# Patient Record
Sex: Female | Born: 1975 | Race: White | Hispanic: No | Marital: Married | Smoking: Never smoker
Health system: Southern US, Community
[De-identification: ages and names within clinical notes are randomized; demographics above are authoritative.]

## PROBLEM LIST (undated history)

## (undated) DIAGNOSIS — I10 Essential (primary) hypertension: Secondary | ICD-10-CM

## (undated) DIAGNOSIS — K449 Diaphragmatic hernia without obstruction or gangrene: Secondary | ICD-10-CM

## (undated) DIAGNOSIS — N200 Calculus of kidney: Secondary | ICD-10-CM

## (undated) DIAGNOSIS — F329 Major depressive disorder, single episode, unspecified: Secondary | ICD-10-CM

## (undated) DIAGNOSIS — K219 Gastro-esophageal reflux disease without esophagitis: Secondary | ICD-10-CM

## (undated) DIAGNOSIS — F410 Panic disorder [episodic paroxysmal anxiety] without agoraphobia: Secondary | ICD-10-CM

## (undated) DIAGNOSIS — F32A Depression, unspecified: Secondary | ICD-10-CM

## (undated) DIAGNOSIS — F419 Anxiety disorder, unspecified: Secondary | ICD-10-CM

## (undated) HISTORY — DX: Diaphragmatic hernia without obstruction or gangrene: K44.9

## (undated) HISTORY — PX: AUGMENTATION MAMMAPLASTY: SUR837

## (undated) HISTORY — DX: Anxiety disorder, unspecified: F41.9

## (undated) HISTORY — PX: TONSILLECTOMY: SUR1361

## (undated) HISTORY — PX: BREAST BIOPSY: SHX20

## (undated) HISTORY — DX: Calculus of kidney: N20.0

## (undated) HISTORY — PX: CHOLECYSTECTOMY: SHX55

## (undated) HISTORY — DX: Essential (primary) hypertension: I10

## (undated) HISTORY — DX: Gastro-esophageal reflux disease without esophagitis: K21.9

## (undated) HISTORY — PX: OTHER SURGICAL HISTORY: SHX169

## (undated) HISTORY — DX: Depression, unspecified: F32.A

## (undated) HISTORY — DX: Major depressive disorder, single episode, unspecified: F32.9

## (undated) HISTORY — PX: BREAST EXCISIONAL BIOPSY: SUR124

## (undated) HISTORY — PX: BREAST ENHANCEMENT SURGERY: SHX7

## (undated) HISTORY — DX: Panic disorder (episodic paroxysmal anxiety): F41.0

---

## 1996-07-10 ENCOUNTER — Encounter: Payer: Self-pay | Admitting: Gastroenterology

## 1997-12-16 ENCOUNTER — Other Ambulatory Visit: Admission: RE | Admit: 1997-12-16 | Discharge: 1997-12-16 | Payer: Self-pay | Admitting: *Deleted

## 1998-03-10 ENCOUNTER — Encounter: Payer: Self-pay | Admitting: Gastroenterology

## 1998-03-10 ENCOUNTER — Ambulatory Visit (HOSPITAL_COMMUNITY): Admission: RE | Admit: 1998-03-10 | Discharge: 1998-03-10 | Payer: Self-pay | Admitting: Gastroenterology

## 1999-01-05 ENCOUNTER — Other Ambulatory Visit: Admission: RE | Admit: 1999-01-05 | Discharge: 1999-01-05 | Payer: Self-pay | Admitting: *Deleted

## 1999-11-09 ENCOUNTER — Other Ambulatory Visit: Admission: RE | Admit: 1999-11-09 | Discharge: 1999-11-09 | Payer: Self-pay | Admitting: *Deleted

## 1999-11-09 ENCOUNTER — Encounter (INDEPENDENT_AMBULATORY_CARE_PROVIDER_SITE_OTHER): Payer: Self-pay | Admitting: Specialist

## 2000-01-06 ENCOUNTER — Other Ambulatory Visit: Admission: RE | Admit: 2000-01-06 | Discharge: 2000-01-06 | Payer: Self-pay | Admitting: Obstetrics and Gynecology

## 2000-05-16 DIAGNOSIS — N200 Calculus of kidney: Secondary | ICD-10-CM

## 2000-05-16 HISTORY — DX: Calculus of kidney: N20.0

## 2001-01-16 ENCOUNTER — Other Ambulatory Visit: Admission: RE | Admit: 2001-01-16 | Discharge: 2001-01-16 | Payer: Self-pay | Admitting: Obstetrics and Gynecology

## 2001-08-07 ENCOUNTER — Encounter (INDEPENDENT_AMBULATORY_CARE_PROVIDER_SITE_OTHER): Payer: Self-pay

## 2001-08-07 ENCOUNTER — Inpatient Hospital Stay (HOSPITAL_COMMUNITY): Admission: AD | Admit: 2001-08-07 | Discharge: 2001-08-10 | Payer: Self-pay | Admitting: Obstetrics and Gynecology

## 2001-09-20 ENCOUNTER — Other Ambulatory Visit: Admission: RE | Admit: 2001-09-20 | Discharge: 2001-09-20 | Payer: Self-pay | Admitting: Obstetrics and Gynecology

## 2002-04-02 ENCOUNTER — Encounter: Payer: Self-pay | Admitting: Internal Medicine

## 2002-04-02 ENCOUNTER — Ambulatory Visit (HOSPITAL_COMMUNITY): Admission: RE | Admit: 2002-04-02 | Discharge: 2002-04-02 | Payer: Self-pay | Admitting: Internal Medicine

## 2002-12-04 ENCOUNTER — Other Ambulatory Visit: Admission: RE | Admit: 2002-12-04 | Discharge: 2002-12-04 | Payer: Self-pay | Admitting: Obstetrics and Gynecology

## 2003-12-16 ENCOUNTER — Inpatient Hospital Stay (HOSPITAL_COMMUNITY): Admission: AD | Admit: 2003-12-16 | Discharge: 2003-12-18 | Payer: Self-pay | Admitting: Obstetrics and Gynecology

## 2004-01-23 ENCOUNTER — Other Ambulatory Visit: Admission: RE | Admit: 2004-01-23 | Discharge: 2004-01-23 | Payer: Self-pay | Admitting: Obstetrics and Gynecology

## 2004-08-20 ENCOUNTER — Other Ambulatory Visit: Admission: RE | Admit: 2004-08-20 | Discharge: 2004-08-20 | Payer: Self-pay | Admitting: Obstetrics and Gynecology

## 2005-01-17 ENCOUNTER — Inpatient Hospital Stay (HOSPITAL_COMMUNITY): Admission: AD | Admit: 2005-01-17 | Discharge: 2005-01-17 | Payer: Self-pay | Admitting: Obstetrics and Gynecology

## 2005-03-07 ENCOUNTER — Inpatient Hospital Stay (HOSPITAL_COMMUNITY): Admission: AD | Admit: 2005-03-07 | Discharge: 2005-03-09 | Payer: Self-pay | Admitting: *Deleted

## 2005-04-04 ENCOUNTER — Other Ambulatory Visit: Admission: RE | Admit: 2005-04-04 | Discharge: 2005-04-04 | Payer: Self-pay | Admitting: Obstetrics and Gynecology

## 2005-10-14 ENCOUNTER — Emergency Department (HOSPITAL_COMMUNITY): Admission: EM | Admit: 2005-10-14 | Discharge: 2005-10-14 | Payer: Self-pay | Admitting: Emergency Medicine

## 2006-03-24 ENCOUNTER — Ambulatory Visit: Payer: Self-pay | Admitting: Gastroenterology

## 2006-03-31 ENCOUNTER — Ambulatory Visit: Payer: Self-pay | Admitting: Gastroenterology

## 2006-04-20 ENCOUNTER — Encounter: Admission: RE | Admit: 2006-04-20 | Discharge: 2006-04-20 | Payer: Self-pay | Admitting: Obstetrics and Gynecology

## 2008-01-05 ENCOUNTER — Inpatient Hospital Stay (HOSPITAL_COMMUNITY): Admission: AD | Admit: 2008-01-05 | Discharge: 2008-01-07 | Payer: Self-pay | Admitting: Obstetrics and Gynecology

## 2008-10-24 DIAGNOSIS — K449 Diaphragmatic hernia without obstruction or gangrene: Secondary | ICD-10-CM | POA: Insufficient documentation

## 2008-10-24 DIAGNOSIS — F410 Panic disorder [episodic paroxysmal anxiety] without agoraphobia: Secondary | ICD-10-CM | POA: Insufficient documentation

## 2008-10-24 DIAGNOSIS — K219 Gastro-esophageal reflux disease without esophagitis: Secondary | ICD-10-CM

## 2008-10-28 ENCOUNTER — Ambulatory Visit: Payer: Self-pay | Admitting: Gastroenterology

## 2008-10-28 DIAGNOSIS — R1032 Left lower quadrant pain: Secondary | ICD-10-CM

## 2008-10-28 DIAGNOSIS — K589 Irritable bowel syndrome without diarrhea: Secondary | ICD-10-CM

## 2008-10-28 DIAGNOSIS — R1012 Left upper quadrant pain: Secondary | ICD-10-CM

## 2008-10-28 DIAGNOSIS — R1084 Generalized abdominal pain: Secondary | ICD-10-CM | POA: Insufficient documentation

## 2008-10-29 ENCOUNTER — Ambulatory Visit: Payer: Self-pay | Admitting: Gastroenterology

## 2008-11-18 ENCOUNTER — Ambulatory Visit: Payer: Self-pay | Admitting: Genetic Counselor

## 2008-11-28 ENCOUNTER — Encounter: Admission: RE | Admit: 2008-11-28 | Discharge: 2008-11-28 | Payer: Self-pay | Admitting: Obstetrics and Gynecology

## 2009-02-10 ENCOUNTER — Ambulatory Visit: Payer: Self-pay | Admitting: Gastroenterology

## 2009-02-10 DIAGNOSIS — R1011 Right upper quadrant pain: Secondary | ICD-10-CM

## 2009-02-10 LAB — CONVERTED CEMR LAB
AST: 25 units/L (ref 0–37)
Basophils Absolute: 0 10*3/uL (ref 0.0–0.1)
Bilirubin, Direct: 0.1 mg/dL (ref 0.0–0.3)
Eosinophils Relative: 2.3 % (ref 0.0–5.0)
Ferritin: 20.3 ng/mL (ref 10.0–291.0)
Folate: >20 ng/mL
GFR calc non Af Amer: 102.52 mL/min (ref >60)
Glucose, Bld: 96 mg/dL (ref 70–99)
HCT: 42 % (ref 36.0–46.0)
Hemoglobin: 14.6 g/dL (ref 12.0–15.0)
Iron: 83 ug/dL (ref 42–145)
MCHC: 34.8 g/dL (ref 30.0–36.0)
MCV: 87.7 fL (ref 78.0–100.0)
Neutro Abs: 5.5 10*3/uL (ref 1.4–7.7)
Platelets: 175 10*3/uL (ref 150.0–400.0)
RBC: 4.79 M/uL (ref 3.87–5.11)
Saturation Ratios: 25.1 % (ref 20.0–50.0)
Sodium: 142 meq/L (ref 135–145)
TSH: 0.7 microintl units/mL (ref 0.35–5.50)
Total Bilirubin: 0.7 mg/dL (ref 0.3–1.2)
Total Protein: 7.2 g/dL (ref 6.0–8.3)
Transferrin: 235.9 mg/dL (ref 212.0–360.0)

## 2009-02-11 ENCOUNTER — Ambulatory Visit: Payer: Self-pay | Admitting: Gastroenterology

## 2009-02-11 ENCOUNTER — Encounter: Payer: Self-pay | Admitting: Gastroenterology

## 2009-02-11 LAB — CONVERTED CEMR LAB: UREASE: NEGATIVE

## 2009-02-12 ENCOUNTER — Telehealth: Payer: Self-pay | Admitting: Gastroenterology

## 2009-02-12 ENCOUNTER — Ambulatory Visit (HOSPITAL_COMMUNITY): Admission: RE | Admit: 2009-02-12 | Discharge: 2009-02-12 | Payer: Self-pay | Admitting: Gastroenterology

## 2009-02-13 ENCOUNTER — Encounter: Payer: Self-pay | Admitting: Gastroenterology

## 2010-03-31 ENCOUNTER — Encounter: Admission: RE | Admit: 2010-03-31 | Discharge: 2010-03-31 | Payer: Self-pay | Admitting: Obstetrics and Gynecology

## 2010-06-07 ENCOUNTER — Encounter: Payer: Self-pay | Admitting: Gastroenterology

## 2010-09-08 ENCOUNTER — Other Ambulatory Visit: Payer: Self-pay | Admitting: Obstetrics and Gynecology

## 2010-09-08 DIAGNOSIS — Z09 Encounter for follow-up examination after completed treatment for conditions other than malignant neoplasm: Secondary | ICD-10-CM

## 2010-10-01 NOTE — Assessment & Plan Note (Signed)
[facility]                           GASTROENTEROLOGY OFFICE NOTE   CURRY, DULSKI                       MRN:          [id number]  DATE:03/24/2006                            DOB:          [birth year]    Ms. Billick is a 35 year old white female, self-referred for right upper  quadrant, right mid-abdominal pain, which has been present for the last  several months.  She describes a gas-like pain, which has no real  precipitating or alleviating elements.  She is having regular bowel  movements.  It occurs several times a day and lasts an hour in duration,  then may relieve for three to four days at a time.  She has had no upper GI  or hepatobiliary complaints, except for recurrent acid reflux for which she  takes Nexium.  She has been off this for the last year because of nursing  her infant child, but has been back on it for the last several weeks and  seems to be doing well with her acid reflux symptoms.  She apparently had an  endoscopy seven or eight years ago, although this report is not available.  She has never had colonoscopy, but has seen Dr. Ouida Sills, had a recent negative  hepatobiliary scan.   She denies clay-colored stools, dark urine, icterus, fever, chills, nausea  and vomiting, anorexia or weight-loss.  She has had no specific food  intolerances.  She has no systemic complaints.  She does not have known  lactose intolerance.   PAST MEDICAL HISTORY:  Otherwise unremarkable, except for a history of panic  disorder, and she is under a lot of personal stress with three small  children, under the age of 23.  She has had no pelvic surgery and has no  other medical problems.   FAMILY HISTORY:  Remarkable for colon polyps in her mother and grandmother.   SOCIAL HISTORY:  She is married and lives with her husband and three  children.  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  She does not  smoke or abuse ethanol.   REVIEW OF  SYSTEMS:  Otherwise entirely noncontributory, without any  cardiovascular, pulmonary, genitourinary, neurologic, orthopedic, endocrine  or gynecologic problems at this time.  She is followed by Dr. Rana Snare for her  GYN.   MEDICATIONS AT THIS TIME:  1. Nexium 40 mg a day.  2. Multivitamins.   ALLERGIES:  In the past, she has had reactions to [facility], LIBRAX and  PROPULSID.   EXAM:  Shows her to be an attractive, healthy-appearing, white female,  appearing stated age, in no distress.  She is 5 feet 6 inches tall and  weighs 134 pounds. Blood pressure 102/64 and pulse of 64 and regular.  I  could not appreciate stigmata of chronic liver disease or thyromegaly.  Her  chest was clear to percussion and auscultation.  She was in a regular rhythm  and I could appreciate no murmurs, gallops or rubs.  There was no  hepatosplenomegaly, abdominal masses or significant tenderness.  Bowel  sounds were normal.  Peripheral extremities were unremarkable.  Rectal exam  was deferred.   ASSESSMENT:  I suspect Mrs. 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  It is unlikely that  she has underlying inflammatory bowel disease.   RECOMMENDATIONS:  1. Low-gas diet as tolerated with p.r.n. sublingual Levsin use.  2. Check screening laboratory parameters, including liver function tests,      amylase and lipase and sprue panel.  3. Repeat upper abdominal ultrasound exam.  4. To have GI followup in several weeks' time and see how she is doing      clinically.  Depending on clinical course, we may need to consider      colonoscopy, especially with a family history of colon polyps in first-      degree relatives.     Vania Rea. Jarold Motto, MD, Caleen Essex, FAGA  Electronically Signed    DRP/MedQ  DD: 03/24/2006  DT: 03/24/2006  Job #: [id number]

## 2010-10-01 NOTE — H&P (Signed)
[facility]  Patient:    Courtney Black, Courtney Black Visit Number: [id number] MRN: [id number]          Service Type: Attending:  Juluis Mire, M.D. Dictated by:   Juluis Mire, M.D. Adm. Date:  08/07/01                           History and Physical  HISTORY OF PRESENT ILLNESS:   The patient is a 35 year old primigravida married female who presents for Cytotec ripening of the cervix and subsequent induction.  Last menstrual period was November 18, 2000 giving her an estimated date of confinement of April 14.  This gives her an estimated gestational age of [redacted] weeks.  By early ultrasound she has an estimated date of confinement of April 25 and an estimated gestational age of [redacted] weeks.  Her prenatal course has been complicated by slightly elevated mean arterial pressures.  Systolic values have ranged in the 140 values with diastolics over 80.  She has been watched with NSTs which have been reactive, and blood work which has been unremarkable.  She did have an ultrasound performed on March 24 that revealed a normal amniotic fluid and growth pattern, although the estimated fetal weight was in the 25th percentile.  Her blood pressure recheck at that time was 150/98.  She now presents for Cytotec ripening of the cervix and subsequent induction.  She does have a positive group B strep screening culture.  Her 50 g Glucola was normal with a 106 value.  ALLERGIES:                    KEFLEX, LIBRAX, and PROPULSID.  HISTORY:                      For past medical history, family history, and social history please see prenatal records.  PRENATAL LABORATORY DATA:     She is A positive, negative antibody screen. Nonreactive serology.  Immune rubella titer.  Negative hepatitis B surface antigen.  Nonreactive HIV screen.  PHYSICAL EXAMINATION:  VITAL SIGNS:                  Patient is afebrile.  Blood pressure 150/98. Other vital signs are stable.  HEENT:                         Patient normocephalic.  Pupils equal, round, and reactive to light and accommodation.  Extraocular movements were intact. Sclerae and conjunctivae were clear.  Oropharynx clear.  NECK:                         Without thyromegaly.  LUNGS:                        Clear.  CARDIOVASCULAR:               Regular rhythm and rate with a grade 2/6 systolic ejection murmur.  No clicks or gallops.  BREASTS:                      Not examined.  ABDOMEN:                      Gravid uterus consistent with dates. PELVIC:  The cervix is long and closed, vertex presenting.  Fetal heart rate is reactive.  EXTREMITIES:                  Show 1+ edema.  Deep tendon reflexes 2+, no clonus.  IMPRESSION:                   1. Intrauterine pregnancy at 37-38 weeks with                                  pregnancy-induced hypertension.                               2. Positive group B strep screen.  PLAN:                         The patient to undergo Cytotec ripening of the cervix with subsequent induction.  Antibiotics will be used for the group B strep. Dictated by:   Juluis Mire, M.D. Attending:  Juluis Mire, M.D. DD:  08/08/01 TD:  08/08/01 Job: [id number]

## 2010-10-04 ENCOUNTER — Ambulatory Visit
Admission: RE | Admit: 2010-10-04 | Discharge: 2010-10-04 | Disposition: A | Discharge status: Home / Self Care | Payer: 59 | Source: Ambulatory Visit | Attending: Obstetrics and Gynecology | Admitting: Obstetrics and Gynecology

## 2010-10-04 DIAGNOSIS — Z09 Encounter for follow-up examination after completed treatment for conditions other than malignant neoplasm: Secondary | ICD-10-CM

## 2011-02-11 ENCOUNTER — Other Ambulatory Visit: Payer: Self-pay | Admitting: Gastroenterology

## 2011-02-16 ENCOUNTER — Encounter: Payer: Self-pay | Admitting: *Deleted

## 2011-02-18 ENCOUNTER — Ambulatory Visit (INDEPENDENT_AMBULATORY_CARE_PROVIDER_SITE_OTHER): Payer: 59 | Admitting: Gastroenterology

## 2011-02-18 ENCOUNTER — Encounter: Payer: Self-pay | Admitting: Gastroenterology

## 2011-02-18 VITALS — BP 112/70 | HR 73 | Ht 66.0 in | Wt 142.0 lb

## 2011-02-18 DIAGNOSIS — K589 Irritable bowel syndrome without diarrhea: Secondary | ICD-10-CM

## 2011-02-18 DIAGNOSIS — Z800 Family history of malignant neoplasm of digestive organs: Secondary | ICD-10-CM | POA: Insufficient documentation

## 2011-02-18 DIAGNOSIS — K625 Hemorrhage of anus and rectum: Secondary | ICD-10-CM

## 2011-02-18 DIAGNOSIS — F410 Panic disorder [episodic paroxysmal anxiety] without agoraphobia: Secondary | ICD-10-CM | POA: Insufficient documentation

## 2011-02-18 MED ORDER — CILIDINIUM-CHLORDIAZEPOXIDE 2.5-5 MG PO CAPS: 1.0000 | ORAL_CAPSULE | Freq: Three times a day (TID) | ORAL | Status: DC

## 2011-02-18 MED ORDER — PRAMOXINE-HC 1-1 % EX CREA: TOPICAL_CREAM | CUTANEOUS | Status: AC

## 2011-02-18 MED ORDER — PEG-KCL-NACL-NASULF-NA ASC-C 100 G PO SOLR: 1.0000 | Freq: Once | ORAL | Status: DC

## 2011-02-18 MED ORDER — ESOMEPRAZOLE MAGNESIUM 40 MG PO CPDR: 40.0000 mg | DELAYED_RELEASE_CAPSULE | Freq: Every day | ORAL | Status: DC

## 2011-02-18 NOTE — Patient Instructions (Signed)
Colon [facility] 02/23/11 2:30 pm arrive at 1:30 pm  Moviprep, Librax, and analpram cream sent to your pharmacy

## 2011-02-18 NOTE — Progress Notes (Signed)
This is a stringy pleasant 35 year old Caucasian female mother of 4 children. Her current right-sided abdominal pain which led to cholecystectomy in 2010. She continued with IBS-type complaints with gas, bloating, and" painful diarrhea" and intermittent rectal bleeding. She has a long history of anxiety and panic attacks and uses Lexapro 5-10 mg on a when necessary basis. For acid reflux she takes Nexium 40 mg a day on a chronic basis. She has had previous endoscopy but not colonoscopy. Family history is remarkable for colon cancer and colon polyps in her mother and father. She has not had previous colonoscopy. The patient relates she has been told in the past that she had a rectal fissure. Her abdominal pain continues to be in the right lower quadrant area, it is definitely exacerbated by stress and partially relieved by bowel movement. She denies any specific food intolerances, anorexia, weight loss, current hepatobiliary or systemic complaints.  Current Medications, Allergies, Past Medical History, Past Surgical History, Family History and Social History were reviewed in [employer] record.  Pertinent Review of Systems Negative   Physical Exam: Healthy appearing in no acute distress. I cannot appreciate stigmata of chronic liver disease. Chest is clear, cardiac exam is unremarkable. There is no hepatosplenomegaly, abdominal masses or tenderness. Bowel sounds are normal. Inspection rectum shows a superficial anterior fissure with a small anal tag. I could not appreciate any fistulization or other abnormalities. Digital exam was not performed. Mental status is normal.    Assessment and Plan: Stress related IBS with probable local rectal bleeding. I placed her on Librax every 6-8 hours on a when necessary basis while continuing a high fiber diet as tolerated. I have scheduled outpatient colonoscopy at her convenience with propofol sedation. I have urged her to resume her Lexapro on  a regular basis , and to continue her Nexium for acid reflux symptoms. Encounter Diagnoses  Name Primary?  . Rectal bleeding Yes  . IBS (irritable bowel syndrome)   . Family history of malignant neoplasm of gastrointestinal tract   . Panic disorder

## 2011-02-23 ENCOUNTER — Encounter: Payer: Self-pay | Admitting: Gastroenterology

## 2011-02-23 ENCOUNTER — Ambulatory Visit (AMBULATORY_SURGERY_CENTER): Payer: 59 | Admitting: Gastroenterology

## 2011-02-23 ENCOUNTER — Other Ambulatory Visit: Payer: Self-pay | Admitting: Gastroenterology

## 2011-02-23 VITALS — BP 131/83 | HR 87 | Temp 99.0°F | Resp 15 | Ht 66.0 in | Wt 142.0 lb

## 2011-02-23 DIAGNOSIS — R1032 Left lower quadrant pain: Secondary | ICD-10-CM

## 2011-02-23 DIAGNOSIS — K625 Hemorrhage of anus and rectum: Secondary | ICD-10-CM

## 2011-02-23 DIAGNOSIS — K589 Irritable bowel syndrome without diarrhea: Secondary | ICD-10-CM

## 2011-02-23 DIAGNOSIS — Z800 Family history of malignant neoplasm of digestive organs: Secondary | ICD-10-CM

## 2011-02-23 MED ORDER — COLESTIPOL HCL 1 G PO TABS: 1.0000 g | ORAL_TABLET | Freq: Every day | ORAL | Status: DC

## 2011-02-23 MED ORDER — SODIUM CHLORIDE 0.9 % IV SOLN: 500.0000 mL | INTRAVENOUS | Status: DC

## 2011-02-23 NOTE — Progress Notes (Signed)
Pt states last week when she saw Dr Jarold Motto he prescribed Librax. Pt has had an allergic reaction to this in the past ( hives ) and she states she did not get it filled and ? If Dr Jarold Motto wanted to change to something different. EWM

## 2011-02-23 NOTE — Patient Instructions (Addendum)
Please refer to the blue and neon green sheets for instructions regarding diet and activity for the rest of today.  Resume previous medications. Follow up appointment on March 25, 2011 at 8;45am.

## 2011-02-23 NOTE — Progress Notes (Signed)
[employer] scope trial in procedure room 3 today.  Frann Rider, [employer] rep, will be in and out of the procedure room. Maw  Pt uncomfortable while the scope was being advanced.  Medications were ordered by Dr. Jarold Motto.  The pt did relax and rested with her eyes closed, comfortably on the way out of the cecum.  maw

## 2011-02-24 ENCOUNTER — Telehealth: Payer: Self-pay | Admitting: *Deleted

## 2011-02-24 NOTE — Telephone Encounter (Signed)

## 2011-03-23 ENCOUNTER — Encounter: Payer: Self-pay | Admitting: Gastroenterology

## 2011-03-25 ENCOUNTER — Ambulatory Visit (INDEPENDENT_AMBULATORY_CARE_PROVIDER_SITE_OTHER): Payer: 59 | Admitting: Gastroenterology

## 2011-03-25 ENCOUNTER — Encounter: Payer: Self-pay | Admitting: Gastroenterology

## 2011-03-25 VITALS — BP 96/62 | HR 64 | Ht 66.0 in | Wt 143.6 lb

## 2011-03-25 DIAGNOSIS — K589 Irritable bowel syndrome without diarrhea: Secondary | ICD-10-CM

## 2011-03-25 DIAGNOSIS — K219 Gastro-esophageal reflux disease without esophagitis: Secondary | ICD-10-CM

## 2011-03-25 NOTE — Progress Notes (Signed)
History of Present Illness: This is a very pleasant 35 year old Caucasian female with IBS, postcholecystectomy diarrhea, GERD, panic disorder. She currently is asymptomatic on her multiple medications, and has adjusted her Colestid appropriately. She denies any significant symptomatology at this time.    Current Medications, Allergies, Past Medical History, Past Surgical History, Family History and Social History were reviewed in [employer] record.   Assessment and plan: Speaking with the patient, she is a large amount of by mouth sorbitol with diet gum. Given her sheet of nonabsorbable carbohydrates to avoid, we'll continue to adjust her Colestid as needed, and to continue her Nexium and Lexapro. We will see her on a when necessary basis as needed. Encounter Diagnoses  Name Primary?  . IBS (irritable bowel syndrome) Yes  . GERD (gastroesophageal reflux disease)     r

## 2012-03-09 ENCOUNTER — Other Ambulatory Visit: Payer: Self-pay | Admitting: Dermatology

## 2012-03-23 ENCOUNTER — Other Ambulatory Visit: Payer: Self-pay | Admitting: Gastroenterology

## 2012-03-23 NOTE — Telephone Encounter (Signed)
MUST HAVE OFFICE VISIT FOR FURTHER REFILLS

## 2012-08-06 ENCOUNTER — Other Ambulatory Visit: Payer: Self-pay | Admitting: Gastroenterology

## 2012-08-07 NOTE — Telephone Encounter (Signed)
PATIENT WILL NEED AN OFFICE VISIT FOR FURTHER REFILLS  

## 2013-08-27 ENCOUNTER — Ambulatory Visit (INDEPENDENT_AMBULATORY_CARE_PROVIDER_SITE_OTHER): Payer: 59 | Admitting: Urology

## 2013-08-27 DIAGNOSIS — IMO0002 Reserved for concepts with insufficient information to code with codable children: Secondary | ICD-10-CM

## 2013-08-27 DIAGNOSIS — R3989 Other symptoms and signs involving the genitourinary system: Secondary | ICD-10-CM

## 2013-10-01 ENCOUNTER — Ambulatory Visit (INDEPENDENT_AMBULATORY_CARE_PROVIDER_SITE_OTHER): Payer: 59 | Admitting: Urology

## 2013-10-01 DIAGNOSIS — IMO0002 Reserved for concepts with insufficient information to code with codable children: Secondary | ICD-10-CM

## 2013-10-01 DIAGNOSIS — N949 Unspecified condition associated with female genital organs and menstrual cycle: Secondary | ICD-10-CM

## 2013-10-01 DIAGNOSIS — R3989 Other symptoms and signs involving the genitourinary system: Secondary | ICD-10-CM

## 2014-01-07 ENCOUNTER — Ambulatory Visit: Payer: 59 | Admitting: Urology

## 2014-07-24 ENCOUNTER — Other Ambulatory Visit: Payer: Self-pay | Admitting: Obstetrics and Gynecology

## 2014-07-25 LAB — CYTOLOGY - PAP

## 2015-09-17 ENCOUNTER — Encounter: Payer: Self-pay | Admitting: Gastroenterology

## 2016-03-01 ENCOUNTER — Other Ambulatory Visit: Payer: Self-pay | Admitting: Obstetrics and Gynecology

## 2016-03-01 DIAGNOSIS — N6310 Unspecified lump in the right breast, unspecified quadrant: Secondary | ICD-10-CM

## 2016-03-08 ENCOUNTER — Ambulatory Visit
Admission: RE | Admit: 2016-03-08 | Discharge: 2016-03-08 | Disposition: A | Discharge status: Home / Self Care | Payer: Commercial Managed Care - HMO | Source: Ambulatory Visit | Attending: Obstetrics and Gynecology | Admitting: Obstetrics and Gynecology

## 2016-03-08 ENCOUNTER — Other Ambulatory Visit: Payer: Self-pay | Admitting: Obstetrics and Gynecology

## 2016-03-08 DIAGNOSIS — N6310 Unspecified lump in the right breast, unspecified quadrant: Secondary | ICD-10-CM

## 2016-06-20 DIAGNOSIS — Z23 Encounter for immunization: Secondary | ICD-10-CM | POA: Diagnosis not present

## 2016-09-07 DIAGNOSIS — Z01419 Encounter for gynecological examination (general) (routine) without abnormal findings: Secondary | ICD-10-CM | POA: Diagnosis not present

## 2016-11-02 DIAGNOSIS — H5213 Myopia, bilateral: Secondary | ICD-10-CM | POA: Diagnosis not present

## 2016-11-02 DIAGNOSIS — H52223 Regular astigmatism, bilateral: Secondary | ICD-10-CM | POA: Diagnosis not present

## 2016-12-29 DIAGNOSIS — Z1389 Encounter for screening for other disorder: Secondary | ICD-10-CM | POA: Diagnosis not present

## 2016-12-29 DIAGNOSIS — Z23 Encounter for immunization: Secondary | ICD-10-CM | POA: Diagnosis not present

## 2016-12-29 DIAGNOSIS — Z9189 Other specified personal risk factors, not elsewhere classified: Secondary | ICD-10-CM | POA: Diagnosis not present

## 2017-01-27 ENCOUNTER — Other Ambulatory Visit: Payer: Self-pay | Admitting: Obstetrics and Gynecology

## 2017-01-27 DIAGNOSIS — Z1231 Encounter for screening mammogram for malignant neoplasm of breast: Secondary | ICD-10-CM

## 2017-03-09 ENCOUNTER — Ambulatory Visit
Admission: RE | Admit: 2017-03-09 | Discharge: 2017-03-09 | Disposition: A | Discharge status: Home / Self Care | Payer: 59 | Source: Ambulatory Visit | Attending: Obstetrics and Gynecology | Admitting: Obstetrics and Gynecology

## 2017-03-09 DIAGNOSIS — Z1231 Encounter for screening mammogram for malignant neoplasm of breast: Secondary | ICD-10-CM

## 2017-09-19 DIAGNOSIS — Z131 Encounter for screening for diabetes mellitus: Secondary | ICD-10-CM | POA: Diagnosis not present

## 2017-09-19 DIAGNOSIS — Z13228 Encounter for screening for other metabolic disorders: Secondary | ICD-10-CM | POA: Diagnosis not present

## 2017-09-19 DIAGNOSIS — Z13220 Encounter for screening for lipoid disorders: Secondary | ICD-10-CM | POA: Diagnosis not present

## 2017-09-19 DIAGNOSIS — Z01419 Encounter for gynecological examination (general) (routine) without abnormal findings: Secondary | ICD-10-CM | POA: Diagnosis not present

## 2017-11-15 DIAGNOSIS — R110 Nausea: Secondary | ICD-10-CM | POA: Diagnosis not present

## 2017-11-15 DIAGNOSIS — S30861A Insect bite (nonvenomous) of abdominal wall, initial encounter: Secondary | ICD-10-CM | POA: Diagnosis not present

## 2017-11-15 DIAGNOSIS — R109 Unspecified abdominal pain: Secondary | ICD-10-CM | POA: Diagnosis not present

## 2018-01-26 DIAGNOSIS — B078 Other viral warts: Secondary | ICD-10-CM | POA: Diagnosis not present

## 2018-01-29 ENCOUNTER — Other Ambulatory Visit: Payer: Self-pay | Admitting: Obstetrics and Gynecology

## 2018-01-29 DIAGNOSIS — Z1231 Encounter for screening mammogram for malignant neoplasm of breast: Secondary | ICD-10-CM

## 2018-03-05 DIAGNOSIS — N83201 Unspecified ovarian cyst, right side: Secondary | ICD-10-CM | POA: Diagnosis not present

## 2018-03-05 DIAGNOSIS — Z3200 Encounter for pregnancy test, result unknown: Secondary | ICD-10-CM | POA: Diagnosis not present

## 2018-03-05 DIAGNOSIS — R1031 Right lower quadrant pain: Secondary | ICD-10-CM | POA: Diagnosis not present

## 2018-03-12 ENCOUNTER — Ambulatory Visit
Admission: RE | Admit: 2018-03-12 | Discharge: 2018-03-12 | Disposition: A | Discharge status: Home / Self Care | Payer: 59 | Source: Ambulatory Visit | Attending: Obstetrics and Gynecology | Admitting: Obstetrics and Gynecology

## 2018-03-12 DIAGNOSIS — Z1231 Encounter for screening mammogram for malignant neoplasm of breast: Secondary | ICD-10-CM

## 2018-09-25 DIAGNOSIS — Z1329 Encounter for screening for other suspected endocrine disorder: Secondary | ICD-10-CM | POA: Diagnosis not present

## 2018-09-25 DIAGNOSIS — Z01419 Encounter for gynecological examination (general) (routine) without abnormal findings: Secondary | ICD-10-CM | POA: Diagnosis not present

## 2018-09-25 DIAGNOSIS — Z6826 Body mass index (BMI) 26.0-26.9, adult: Secondary | ICD-10-CM | POA: Diagnosis not present

## 2018-09-25 DIAGNOSIS — Z1321 Encounter for screening for nutritional disorder: Secondary | ICD-10-CM | POA: Diagnosis not present

## 2018-09-25 DIAGNOSIS — Z131 Encounter for screening for diabetes mellitus: Secondary | ICD-10-CM | POA: Diagnosis not present

## 2019-03-06 ENCOUNTER — Other Ambulatory Visit: Payer: Self-pay | Admitting: Obstetrics and Gynecology

## 2019-03-06 DIAGNOSIS — Z1231 Encounter for screening mammogram for malignant neoplasm of breast: Secondary | ICD-10-CM

## 2019-04-25 ENCOUNTER — Ambulatory Visit
Admission: RE | Admit: 2019-04-25 | Discharge: 2019-04-25 | Disposition: A | Discharge status: Home / Self Care | Payer: 59 | Source: Ambulatory Visit | Attending: Obstetrics and Gynecology | Admitting: Obstetrics and Gynecology

## 2019-04-25 ENCOUNTER — Other Ambulatory Visit: Payer: Self-pay

## 2019-04-25 DIAGNOSIS — Z1231 Encounter for screening mammogram for malignant neoplasm of breast: Secondary | ICD-10-CM

## 2020-05-21 ENCOUNTER — Other Ambulatory Visit: Payer: Self-pay | Admitting: Obstetrics and Gynecology

## 2020-05-21 DIAGNOSIS — Z1231 Encounter for screening mammogram for malignant neoplasm of breast: Secondary | ICD-10-CM

## 2020-07-02 ENCOUNTER — Ambulatory Visit
Admission: RE | Admit: 2020-07-02 | Discharge: 2020-07-02 | Disposition: A | Discharge status: Home / Self Care | Payer: 59 | Source: Ambulatory Visit | Attending: Obstetrics and Gynecology | Admitting: Obstetrics and Gynecology

## 2020-07-02 ENCOUNTER — Other Ambulatory Visit: Payer: Self-pay

## 2020-07-02 DIAGNOSIS — Z1231 Encounter for screening mammogram for malignant neoplasm of breast: Secondary | ICD-10-CM

## 2020-07-07 ENCOUNTER — Other Ambulatory Visit: Payer: Self-pay | Admitting: Obstetrics and Gynecology

## 2020-07-07 DIAGNOSIS — R928 Other abnormal and inconclusive findings on diagnostic imaging of breast: Secondary | ICD-10-CM

## 2020-07-16 ENCOUNTER — Other Ambulatory Visit: Payer: Self-pay

## 2020-07-16 ENCOUNTER — Ambulatory Visit
Admission: RE | Admit: 2020-07-16 | Discharge: 2020-07-16 | Disposition: A | Discharge status: Home / Self Care | Payer: 59 | Source: Ambulatory Visit | Attending: Obstetrics and Gynecology | Admitting: Obstetrics and Gynecology

## 2020-07-16 ENCOUNTER — Other Ambulatory Visit: Payer: Self-pay | Admitting: Obstetrics and Gynecology

## 2020-07-16 DIAGNOSIS — R928 Other abnormal and inconclusive findings on diagnostic imaging of breast: Secondary | ICD-10-CM

## 2021-01-22 ENCOUNTER — Other Ambulatory Visit: Payer: Self-pay

## 2021-01-22 ENCOUNTER — Ambulatory Visit
Admission: RE | Admit: 2021-01-22 | Discharge: 2021-01-22 | Disposition: A | Discharge status: Home / Self Care | Payer: 59 | Source: Ambulatory Visit | Attending: Obstetrics and Gynecology | Admitting: Obstetrics and Gynecology

## 2021-01-22 DIAGNOSIS — R928 Other abnormal and inconclusive findings on diagnostic imaging of breast: Secondary | ICD-10-CM

## 2021-09-02 ENCOUNTER — Other Ambulatory Visit: Payer: Self-pay | Admitting: Obstetrics and Gynecology

## 2021-09-02 DIAGNOSIS — Z1231 Encounter for screening mammogram for malignant neoplasm of breast: Secondary | ICD-10-CM

## 2021-09-03 ENCOUNTER — Ambulatory Visit
Admission: RE | Admit: 2021-09-03 | Discharge: 2021-09-03 | Disposition: A | Discharge status: Home / Self Care | Payer: 59 | Source: Ambulatory Visit | Attending: Obstetrics and Gynecology | Admitting: Obstetrics and Gynecology

## 2021-09-03 DIAGNOSIS — Z1231 Encounter for screening mammogram for malignant neoplasm of breast: Secondary | ICD-10-CM

## 2021-09-10 ENCOUNTER — Encounter: Payer: Self-pay | Admitting: Gastroenterology

## 2021-09-10 ENCOUNTER — Ambulatory Visit (INDEPENDENT_AMBULATORY_CARE_PROVIDER_SITE_OTHER): Payer: 59 | Admitting: Gastroenterology

## 2021-09-10 VITALS — BP 130/82 | HR 70 | Ht 66.0 in | Wt 182.2 lb

## 2021-09-10 DIAGNOSIS — Z1211 Encounter for screening for malignant neoplasm of colon: Secondary | ICD-10-CM | POA: Diagnosis not present

## 2021-09-10 DIAGNOSIS — K219 Gastro-esophageal reflux disease without esophagitis: Secondary | ICD-10-CM | POA: Diagnosis not present

## 2021-09-10 DIAGNOSIS — Z1212 Encounter for screening for malignant neoplasm of rectum: Secondary | ICD-10-CM | POA: Diagnosis not present

## 2021-09-10 DIAGNOSIS — T781XXA Other adverse food reactions, not elsewhere classified, initial encounter: Secondary | ICD-10-CM

## 2021-09-10 MED ORDER — SUTAB 1479-225-188 MG PO TABS: 1.0000 | ORAL_TABLET | Freq: Once | ORAL | 0 refills | Status: AC

## 2021-09-10 MED ORDER — OMEPRAZOLE 20 MG PO CPDR: 20.0000 mg | DELAYED_RELEASE_CAPSULE | ORAL | 3 refills | Status: DC | PRN

## 2021-09-10 NOTE — Progress Notes (Signed)
? ?HPI : Courtney Black is a very pleasant 46 year old female with history of anxiety, depression and IBS who is referred to [place] bu Dr. Louretta Shorten for colon cancer screening.  Patient has a long history of severe abdominal pain attacks with nausea and diarrhea.  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  She has cut out red meat and pork, and since then has not had any problems with abdominal pain.  She has regular bowel movements most days.  Her stool consistency does vary, but she denies problems with constipation, diarrhea or blood in her stool.  No family history of colon cancer.  She had a colonoscopy in October 2012 to evaluate her GI symptoms, which was completely normal. ?She also has a history of chronic GERD symptoms.  She underwent an upper endoscopy in 1998 and again in 2010.  Both times, she was noted to have a hiatal hernia, but normal-appearing esophagus without evidence of esophagitis.  Her GERD symptoms have improved.  She used to take PPIs daily for many years.  Now, she takes PPIs on a as needed basis, which is usually related to certain foods.  Her GERD symptoms consist of heartburn and acid regurgitation. ? ? ?Past Medical History:  ?Diagnosis Date  ? Anxiety   ? Depression   ? Diaphragmatic hernia without mention of obstruction or gangrene   ? Esophageal reflux   ? Kidney stone 2002  ? Panic disorder without agoraphobia   ? ?Colonoscopy, February 23, 2011 (Dr. Sharlett Iles): Normal colonoscopy ?EGD, February 11, 2009 (Dr. Sharlett Iles): 2 cm hiatal hernia, otherwise normal EGD ?EGD, July 10, 1996 (Dr. Sharlett Iles): 4-5 cm hiatal hernia, no erosive esophagitis or stricture, normal stomach and duodenum ?Past Surgical History:  ?Procedure Laterality Date  ? BREAST BIOPSY    ? lt. breast  ? BREAST ENHANCEMENT SURGERY    ? CHOLECYSTECTOMY    ? mini tummy tuck    ? excess skin removed from abdomen  ? TONSILLECTOMY    ? ?Family History  ?Problem Relation Age of Onset  ?  Colon polyps Mother   ?     mgm  ? Breast cancer Mother   ? Prostate cancer Other   ? Diabetes Maternal Grandfather   ? Colon cancer Neg Hx   ? ?Social History  ? ?Tobacco Use  ? Smoking status: Never  ? Smokeless tobacco: Never  ?Substance Use Topics  ? Alcohol use: No  ? Drug use: No  ? ?Current Outpatient Medications  ?Medication Sig Dispense Refill  ? colestipol (COLESTID) 1 G tablet Take 1 tablet (1 g total) by mouth daily. 30 tablet 2  ? escitalopram (LEXAPRO) 5 MG tablet Take 10 mg by mouth daily.     ? NEXIUM 40 MG capsule TAKE 1 CAPSULE BY MOUTH EVERY DAY BEFORE BREAKFAST 30 capsule 0  ? ?No current facility-administered medications for this visit.  ? ?Allergies  ?Allergen Reactions  ? Cephalexin   ? Chlordiazepoxide-Clidinium   ? Propulsid [Cisapride]   ? ? ? ?Review of Systems: ?All systems reviewed and negative except where noted in HPI.  ? ? ?MM 3D SCREEN BREAST BILATERAL ? ?Result Date: 09/06/2021 ?CLINICAL DATA:  Screening. EXAM: DIGITAL SCREENING BILATERAL MAMMOGRAM WITH TOMOSYNTHESIS AND CAD TECHNIQUE: Bilateral screening digital craniocaudal and mediolateral oblique mammograms were obtained. Bilateral screening digital breast tomosynthesis was performed. The images were evaluated with computer-aided detection. COMPARISON:  Previous exam(s). ACR Breast Density Category c: The breast tissue is heterogeneously  dense, which may obscure small masses. FINDINGS: There are no findings suspicious for malignancy. IMPRESSION: No mammographic evidence of malignancy. A result letter of this screening mammogram will be mailed directly to the patient. RECOMMENDATION: Screening mammogram in one year. (Code:SM-B-01Y) BI-RADS CATEGORY  1: Negative. Electronically Signed   By: Claudie Revering M.D.   On: 09/06/2021 12:18   ? ?Physical Exam: ?There were no vitals taken for this visit. ?Constitutional: Pleasant,well-developed, Caucasian female in no acute distress. ?HEENT: Normocephalic and atraumatic. Conjunctivae are  normal. No scleral icterus. ?Neck supple.  ?Cardiovascular: Normal rate, regular rhythm.  ?Pulmonary/chest: Effort normal and breath sounds normal. No wheezing, rales or rhonchi. ?Abdominal: Soft, nondistended, nontender. Bowel sounds active throughout. There are no masses palpable. No hepatomegaly. ?Extremities: no edema ?Neurological: Alert and oriented to person place and time. ?Skin: Skin is warm and dry. No rashes noted. ?Psychiatric: Normal mood and affect. Behavior is normal. ? ?CBC ?   ?Component Value Date/Time  ? WBC 8.1 02/10/2009 1131  ? RBC 4.79 02/10/2009 1131  ? HGB 14.6 02/10/2009 1131  ? HCT 42.0 02/10/2009 1131  ? PLT 175.0 02/10/2009 1131  ? MCV 87.7 02/10/2009 1131  ? MCHC 34.8 02/10/2009 1131  ? RDW 12.7 02/10/2009 1131  ? LYMPHSABS 2.1 02/10/2009 1131  ? MONOABS 0.3 02/10/2009 1131  ? EOSABS 0.2 02/10/2009 1131  ? BASOSABS 0.0 02/10/2009 1131  ? ? ?CMP  ?   ?Component Value Date/Time  ? NA 142 02/10/2009 1131  ? K 3.9 02/10/2009 1131  ? CL 109 02/10/2009 1131  ? CO2 31 02/10/2009 1131  ? GLUCOSE 96 02/10/2009 1131  ? BUN 7 02/10/2009 1131  ? CREATININE 0.7 02/10/2009 1131  ? CALCIUM 9.2 02/10/2009 1131  ? PROT 7.2 02/10/2009 1131  ? ALBUMIN 4.4 02/10/2009 1131  ? AST 25 02/10/2009 1131  ? ALT 26 02/10/2009 1131  ? ALKPHOS 60 02/10/2009 1131  ? BILITOT 0.7 02/10/2009 1131  ? GFRNONAA 102.52 02/10/2009 1131  ? ? ? 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  Currently with no chronic lower GI symptoms.  Her GERD symptoms are well managed with as needed omeprazole.  She has had 2 upper endoscopies in the past when her symptoms were much worse which did not demonstrate any complications of GERD (esophagitis, peptic stricture).  She does have a hiatal hernia which predisposes her to GERD.  Overall, there is no indication to repeat an upper endoscopy.  The patient is due for her average  rescreening colonoscopy.  We will schedule her for colonoscopy.  No further evaluation or treatment needed.  Recommend patient continue her omeprazole as needed. ? ?Colon cancer screening ?- Screening colonoscopy ? ?GERD ?-continue as needed omeprazole ? 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 ?The details, risks (including bleeding, perforation, infection, missed lesions, medication reactions and possible hospitalization or surgery if complications occur), benefits, and alternatives to colonoscopy with possible biopsy and possible polypectomy were discussed with the patient and she consents to proceed.  ? ?Keirstin Musil E. Candis Schatz, MD ?[facility] ? ? ?CC:  Louretta Shorten, MD ? ?

## 2021-09-10 NOTE — Patient Instructions (Signed)
If you are age 46 or older, your body mass index should be between 23-30. Your Body mass index is 29.42 kg/m?Marland Kitchen If this is out of the aforementioned range listed, please consider follow up with your Primary Care Provider. ? ?If you are age 82 or younger, your body mass index should be between 19-25. Your Body mass index is 29.42 kg/m?Marland Kitchen If this is out of the aformentioned range listed, please consider follow up with your Primary Care Provider.  ? ?You have been scheduled for a colonoscopy. Please follow written instructions given to you at your visit today.  ?Please pick up your prep supplies at the pharmacy within the next 1-3 days. ?If you use inhalers (even only as needed), please bring them with you on the day of your procedure. ? ?We have sent the following medications to your pharmacy for you to pick up at your convenience: ?Omeprazole 20 mg  ? ?________________________________________________________ ? ?The [facility] providers would like to encourage you to use [facility] to communicate with providers for non-urgent requests or questions.  Due to long hold times on the telephone, sending your provider a message by [facility] may be a faster and more efficient way to get a response.  Please allow 48 business hours for a response.  Please remember that this is for non-urgent requests.  ?_______________________________________________________ ? ?It was a pleasure to see you today! ? ?Thank you for trusting me with your gastrointestinal care!   ? ?Scott E.Candis Schatz, MD  ? ?

## 2021-09-30 ENCOUNTER — Encounter: Payer: Self-pay | Admitting: Gastroenterology

## 2021-10-08 ENCOUNTER — Ambulatory Visit (AMBULATORY_SURGERY_CENTER): Payer: 59 | Admitting: Gastroenterology

## 2021-10-08 ENCOUNTER — Encounter: Payer: Self-pay | Admitting: Gastroenterology

## 2021-10-08 VITALS — BP 124/83 | HR 64 | Temp 99.3°F | Resp 16 | Ht 66.0 in | Wt 182.0 lb

## 2021-10-08 DIAGNOSIS — D123 Benign neoplasm of transverse colon: Secondary | ICD-10-CM

## 2021-10-08 DIAGNOSIS — Z1211 Encounter for screening for malignant neoplasm of colon: Secondary | ICD-10-CM

## 2021-10-08 DIAGNOSIS — K635 Polyp of colon: Secondary | ICD-10-CM | POA: Diagnosis not present

## 2021-10-08 LAB — POCT URINE PREGNANCY: Preg Test, Ur: NEGATIVE

## 2021-10-08 MED ORDER — SODIUM CHLORIDE 0.9 % IV SOLN: 500.0000 mL | INTRAVENOUS | Status: DC

## 2021-10-08 NOTE — Op Note (Signed)
Courtney Black Patient Name: Courtney Black Procedure Date: 10/08/2021 1:38 PM MRN: [id number] Endoscopist: Nicki Reaper E. Candis Schatz , MD Age: 46 Referring MD:  Date of Birth: [birth year] Gender: Female Account #: [id number] Procedure:                Colonoscopy Indications:              Screening for colorectal malignant neoplasm Medicines:                Monitored Anesthesia Care Procedure:                Pre-Anesthesia Assessment:                           - Prior to the procedure, a History and Physical                            was performed, and patient medications and                            allergies were reviewed. The patient's tolerance of                            previous anesthesia was also reviewed. The risks                            and benefits of the procedure and the sedation                            options and risks were discussed with the patient.                            All questions were answered, and informed consent                            was obtained. Prior Anticoagulants: The patient has                            taken no previous anticoagulant or antiplatelet                            agents. ASA Grade Assessment: II - A patient with                            mild systemic disease. After reviewing the risks                            and benefits, the patient was deemed in                            satisfactory condition to undergo the procedure.                           After obtaining informed consent, the colonoscope  was passed under direct vision. Throughout the                            procedure, the patient's blood pressure, pulse, and                            oxygen saturations were monitored continuously. The                            CF [id number] #[id number] was introduced through the anus                            and advanced to the the terminal ileum, with                             identification of the appendiceal orifice and IC                            valve. The colonoscopy was performed without                            difficulty. The patient tolerated the procedure                            well. The quality of the bowel preparation was                            good. The terminal ileum, ileocecal valve,                            appendiceal orifice, and rectum were photographed.                            The bowel preparation used was Sutab via split dose                            instruction. Scope In: 1:42:48 PM Scope Out: 2:05:49 PM Scope Withdrawal Time: 0 hours 14 minutes 36 seconds  Total Procedure Duration: 0 hours 23 minutes 1 second  Findings:                 The perianal and digital rectal examinations were                            normal. Pertinent negatives include normal                            sphincter tone and no palpable rectal lesions.                           A 7 mm polyp was found in the proximal transverse                            colon. The polyp was flat. The polyp was removed  with a cold snare. Resection and retrieval were                            complete. Estimated blood loss was minimal.                           The exam was otherwise normal throughout the                            examined colon.                           The terminal ileum appeared normal.                           The retroflexed view of the distal rectum and anal                            verge was normal and showed no anal or rectal                            abnormalities. Complications:            No immediate complications. Estimated Blood Loss:     Estimated blood loss was minimal. Impression:               - One 7 mm polyp in the proximal transverse colon,                            removed with a cold snare. Resected and retrieved.                           - The examined portion of the ileum was normal.                            - The distal rectum and anal verge are normal on                            retroflexion view. Recommendation:           - Patient has a contact number available for                            emergencies. The signs and symptoms of potential                            delayed complications were discussed with the                            patient. Return to normal activities tomorrow.                            Written discharge instructions were provided to the  patient.                           - Resume previous diet.                           - Continue present medications.                           - Await pathology results.                           - Repeat colonoscopy (date not yet determined) for                            surveillance of multiple polyps. Courtney Black E. Candis Schatz, MD 10/08/2021 2:11:43 PM This report has been signed electronically.

## 2021-10-08 NOTE — Progress Notes (Signed)
History and Physical Interval Note:  10/08/2021 1:21 PM  Courtney Black  has presented today for endoscopic procedure(s), with the diagnosis of  Encounter Diagnosis  Name Primary?   Screening for colorectal cancer Yes  .  The various methods of evaluation and treatment have been discussed with the patient and/or family. After consideration of risks, benefits and other options for treatment, the patient has consented to  the endoscopic procedure(s).   The patient's history has been reviewed, patient examined, no change in status, stable for endoscopic procedure(s).  I have reviewed the patient's chart and labs.  Questions were answered to the patient's satisfaction.     Nakeya Adinolfi E. Candis Schatz, MD [facility]

## 2021-10-08 NOTE — Progress Notes (Signed)
Called to room to assist during endoscopic procedure.  Patient ID and intended procedure confirmed with present staff. Received instructions for my participation in the procedure from the performing physician.  

## 2021-10-08 NOTE — Patient Instructions (Signed)

## 2021-10-08 NOTE — Progress Notes (Signed)
Sedate, gd SR, tolerated procedure well, VSS, report to RN 

## 2021-10-12 ENCOUNTER — Telehealth: Payer: Self-pay

## 2021-10-12 NOTE — Telephone Encounter (Signed)
  Follow up Call-     10/08/2021   12:56 PM  Call back number  Post procedure Call Back phone  # [phone]  Permission to leave phone message Yes     Patient questions:  Do you have a fever, pain , or abdominal swelling? No. Pain Score  0 *  Have you tolerated food without any problems? Yes.    Have you been able to return to your normal activities? Yes.    Do you have any questions about your discharge instructions: Diet   No. Medications  No. Follow up visit  No.  Do you have questions or concerns about your Care? No.  Actions: * If pain score is 4 or above: No action needed, pain <4.

## 2021-10-20 NOTE — Progress Notes (Signed)
Courtney Black, The polyp which I removed during your recent procedure was proven to be completely benign but is considered a "pre-cancerous" polyp that MAY have grown into cancer if it had not been removed.  Studies shows that at least 20% of women over age 46 and 30% of men over age 43 have pre-cancerous polyps.  Based on current nationally recognized surveillance guidelines, I recommend that you have a repeat colonoscopy in 7 years.   If you develop any new rectal bleeding, abdominal pain or significant bowel habit changes, please contact me before then.

## 2022-06-30 IMAGING — MG MM DIGITAL SCREENING BILAT W/ TOMO AND CAD
8 series · 9 of 24 positions shown · non-contrast
Comparison: Previous exam(s).

CLINICAL DATA: Screening.

EXAM:
DIGITAL SCREENING BILATERAL MAMMOGRAM WITH TOMOSYNTHESIS AND CAD
TECHNIQUE: Bilateral screening digital craniocaudal and mediolateral oblique
mammograms were obtained. Bilateral screening digital breast
tomosynthesis was performed. The images were evaluated with
computer-aided detection.

[L CC synth-2D]
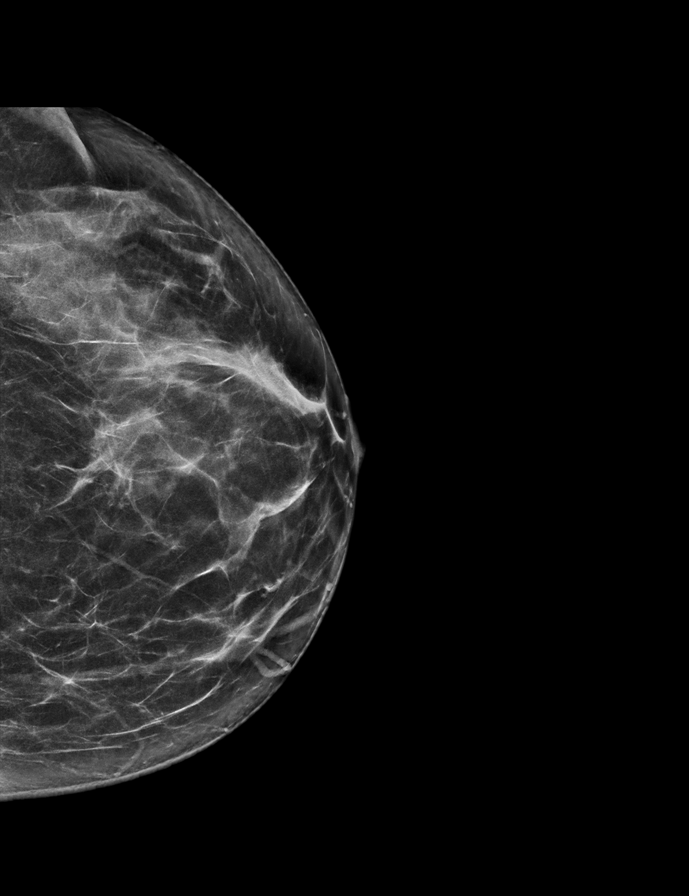

[R CC synth-2D]
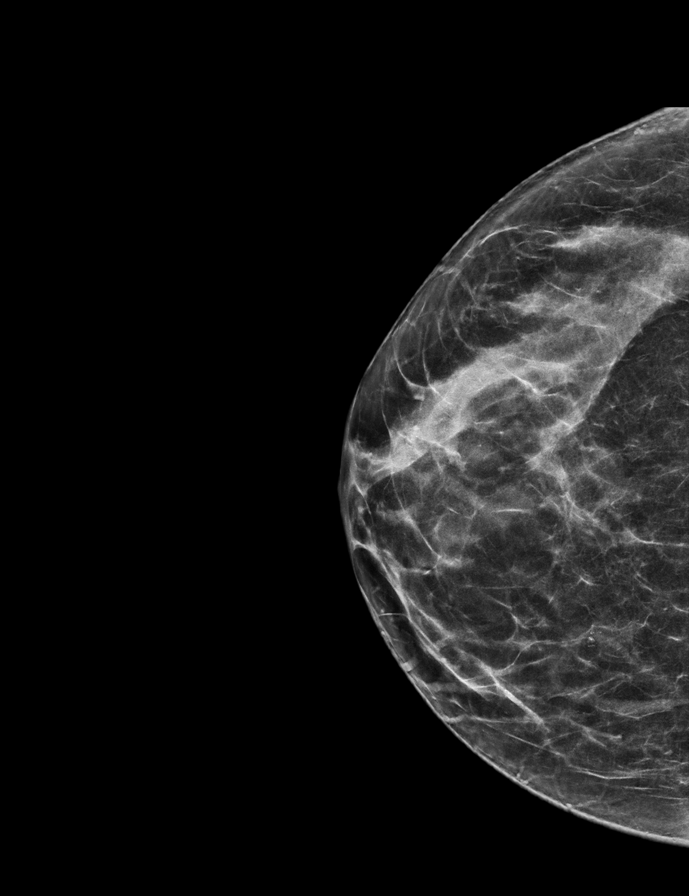

[R MLO synth-2D]
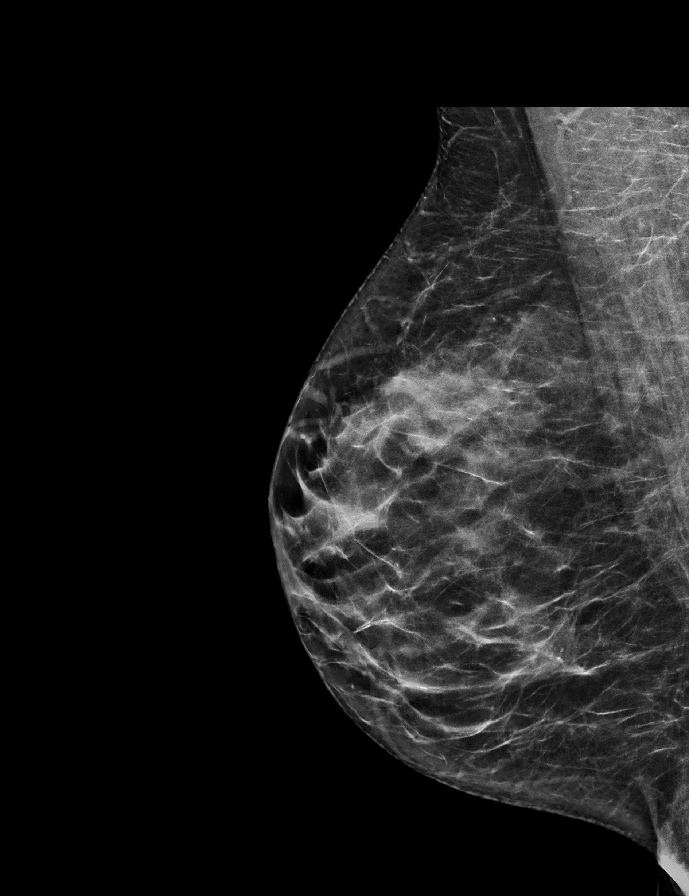

[L MLO synth-2D]
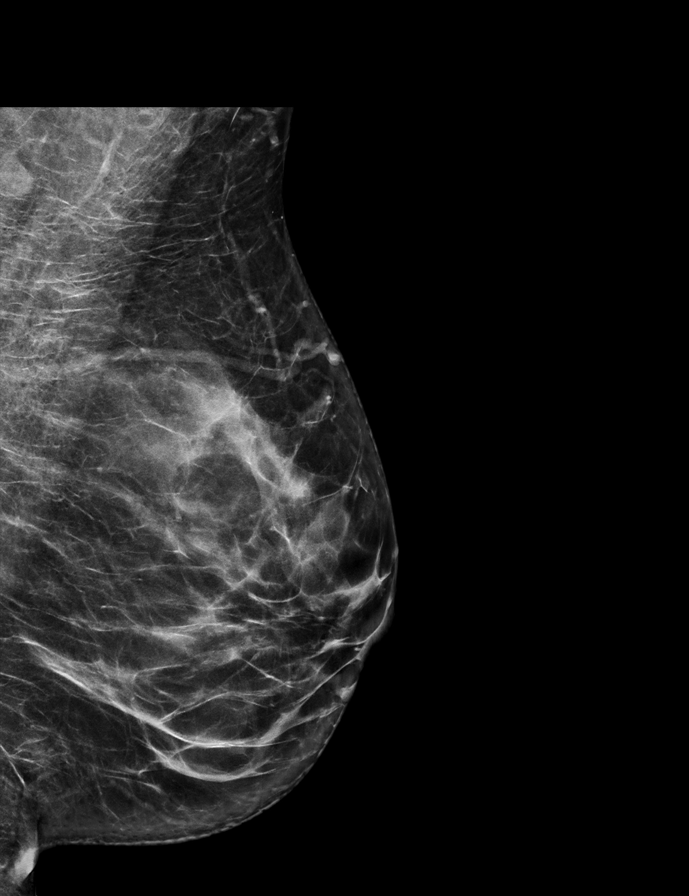

[R MLO tomo · 2 of 62 frames shown]
[frame 21/62]
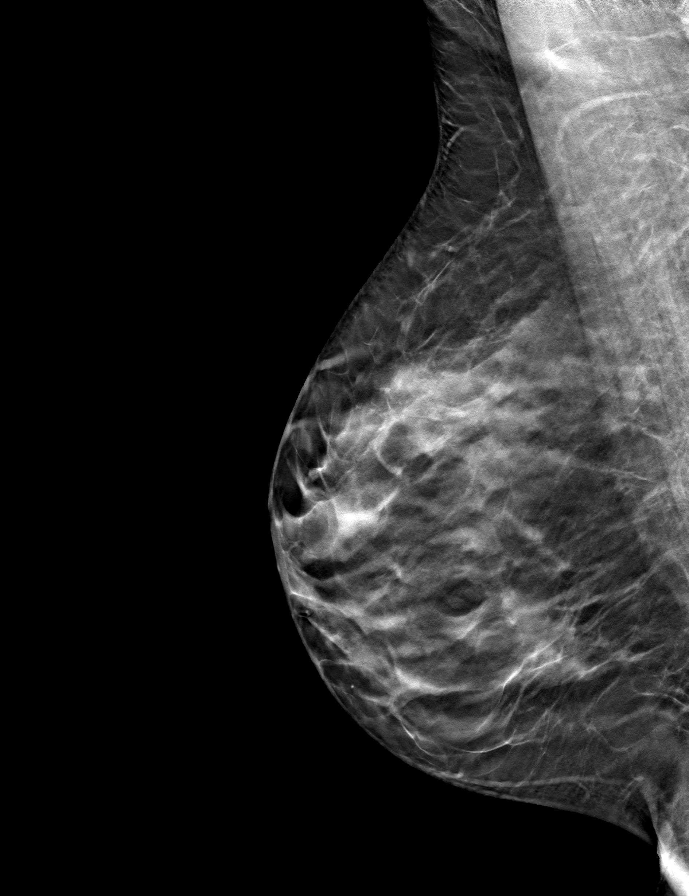
[frame 31/62]
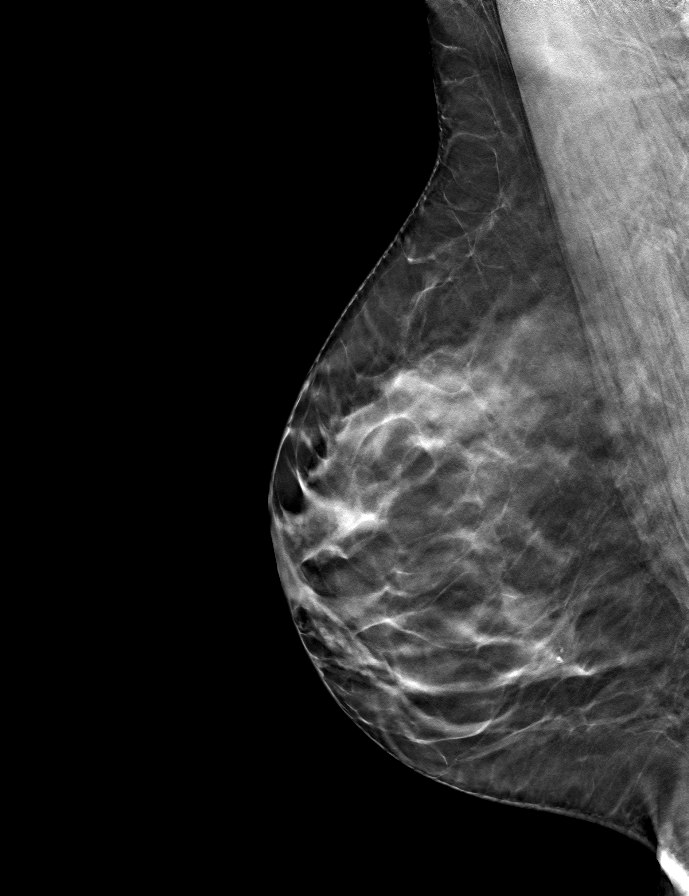

[R CC tomo · tomo slice 29/58.0]
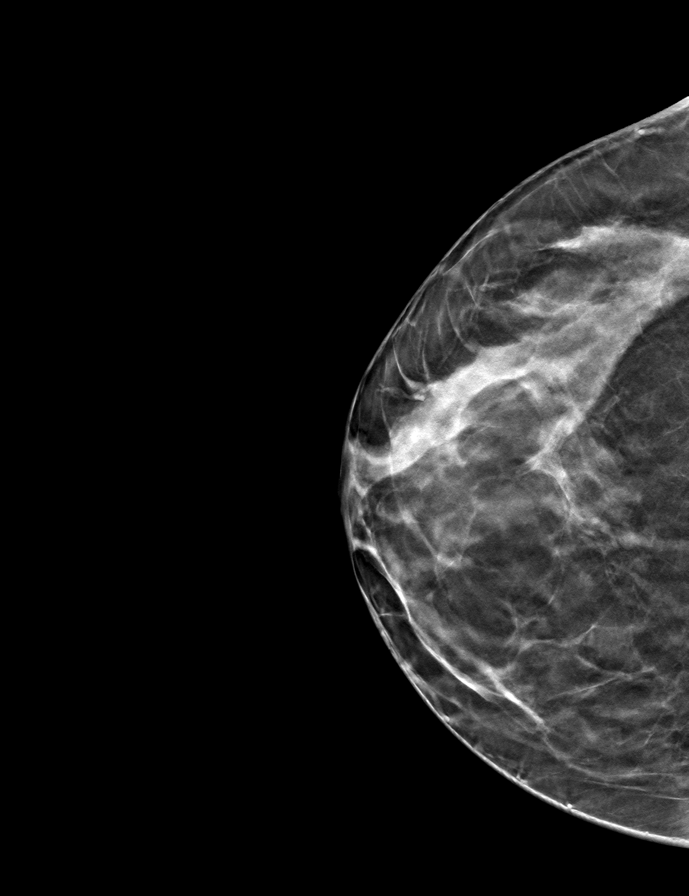

[L CC tomo · tomo slice 33/64.0]
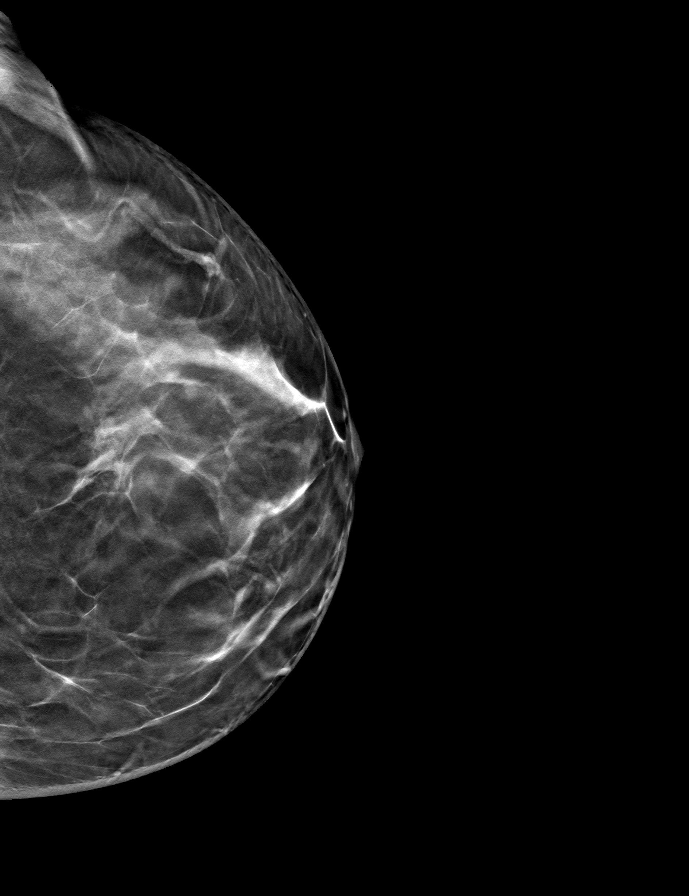

[L MLO tomo · tomo slice 35/69.0]
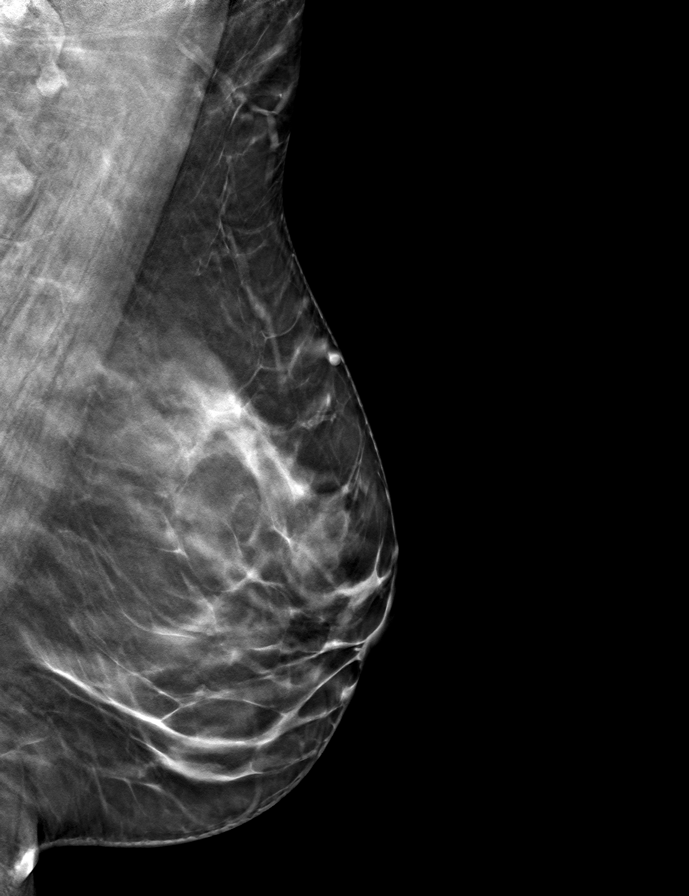

[9 of 24 positions shown; findings below may reference images not displayed]

ACR Breast Density Category c: The breast tissue is heterogeneously
dense, which may obscure small masses.
FINDINGS: There are no findings suspicious for malignancy.
IMPRESSION: No mammographic evidence of malignancy. A result letter of this
screening mammogram will be mailed directly to the patient.

RECOMMENDATION:
Screening mammogram in one year. (Code:Q3-W-BC3)

BI-RADS CATEGORY  1: Negative.

## 2022-09-06 ENCOUNTER — Other Ambulatory Visit: Payer: Self-pay | Admitting: Obstetrics and Gynecology

## 2022-09-06 DIAGNOSIS — Z1231 Encounter for screening mammogram for malignant neoplasm of breast: Secondary | ICD-10-CM

## 2022-10-06 ENCOUNTER — Ambulatory Visit
Admission: RE | Admit: 2022-10-06 | Discharge: 2022-10-06 | Disposition: A | Discharge status: Home / Self Care | Payer: 59 | Source: Ambulatory Visit

## 2022-10-06 DIAGNOSIS — Z1231 Encounter for screening mammogram for malignant neoplasm of breast: Secondary | ICD-10-CM

## 2022-10-26 ENCOUNTER — Ambulatory Visit: Payer: 59 | Admitting: Podiatry

## 2022-10-26 ENCOUNTER — Ambulatory Visit (INDEPENDENT_AMBULATORY_CARE_PROVIDER_SITE_OTHER): Payer: 59

## 2022-10-26 DIAGNOSIS — M79672 Pain in left foot: Secondary | ICD-10-CM

## 2022-10-26 DIAGNOSIS — M79671 Pain in right foot: Secondary | ICD-10-CM

## 2022-10-26 DIAGNOSIS — M778 Other enthesopathies, not elsewhere classified: Secondary | ICD-10-CM

## 2022-10-26 MED ORDER — METHYLPREDNISOLONE 4 MG PO TBPK: ORAL_TABLET | ORAL | 0 refills | Status: AC

## 2022-10-26 MED ORDER — BETAMETHASONE SOD PHOS & ACET 6 (3-3) MG/ML IJ SUSP
3.0000 mg | Freq: Once | INTRAMUSCULAR | Status: AC
Start: 2022-10-26 — End: 2022-10-26
  Administered 2022-10-26: 3 mg via INTRA_ARTICULAR

## 2022-10-26 NOTE — Progress Notes (Signed)
Chief Complaint  Patient presents with   Foot Pain    Patient came in today for bilateral foot pain, arches lateral side of the foot, started 3 months ago, left is worse than the right, morning pain, after sitting, rate of pain 7 out of 10, X-Rays done today    Subjective: 47 y.o. female presenting today as a new patient for evaluation of pain and tenderness associated to the lateral aspect of the bilateral great toes.  Left is greater than the right.  Ongoing for about 3 months.  Idiopathic onset.  Patient has tried wearing OTC arch supports but that seems to aggravate the foot even more.  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  On her feet all day.  She wears muck boots and new balance tennis shoes regularly daily.  She admits to walking around the house barefoot   Past Medical History:  Diagnosis Date   Anxiety    Depression    Diaphragmatic hernia without mention of obstruction or gangrene    Esophageal reflux    High blood pressure    Kidney stone 05/16/2000   Panic disorder without agoraphobia    Past Surgical History:  Procedure Laterality Date   AUGMENTATION MAMMAPLASTY     Explant   BREAST BIOPSY     lt. breast   BREAST ENHANCEMENT SURGERY     BREAST EXCISIONAL BIOPSY Left    CHOLECYSTECTOMY     mini tummy tuck     excess skin removed from abdomen   TONSILLECTOMY     Allergies  Allergen Reactions   Cephalexin    Chlordiazepoxide-Clidinium    Propulsid [Cisapride]      Objective: Physical Exam General: The patient is alert and oriented x3 in no acute distress.  Dermatology: Skin is warm, dry and supple bilateral lower extremities. Negative for open lesions or macerations bilateral.   Vascular: Dorsalis Pedis and Posterior Tibial pulses palpable bilateral.  Capillary fill time is immediate to all digits.  Neurological: Grossly intact via light touch  Musculoskeletal: No gross deformity.   Normal foot structure.  Tenderness palpation along the fifth metatarsal and lateral column of the left foot.  The majority of her pain is on the plantar aspect of the fifth ray left greater than right.  Radiographic exam B/L feet 10/26/2022: Normal osseous mineralization. Joint spaces preserved. No fracture/dislocation/boney destruction. No other soft tissue abnormalities or radiopaque foreign bodies.  Accessory navicular noted bilateral.  Accessory navicular noted bilateral.  Impression: Accessory navicular bilateral  Assessment: 1.  Fifth ray capsulitis bilateral LT>RT  Plan of Care:  -Patient evaluated. Xrays reviewed.   -Injection of 0.5cc Celestone soluspan injected along the fifth ray of the left foot -Rx for Medrol Dose Pak placed - Declined meloxicam 15 mg.  Continue OTC Motrin as needed after completion of the Dosepak -Advised against going barefoot.  Recommend good supportive shoes and or house slippers around the house.  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 Felecia Shelling, DPM    [address]                                   [place], [place]  [place] [phone]  Fax [phone]

## 2023-03-20 ENCOUNTER — Other Ambulatory Visit: Payer: Self-pay | Admitting: Gastroenterology

## 2023-10-02 ENCOUNTER — Other Ambulatory Visit: Payer: Self-pay | Admitting: Obstetrics and Gynecology

## 2023-10-02 DIAGNOSIS — Z1231 Encounter for screening mammogram for malignant neoplasm of breast: Secondary | ICD-10-CM

## 2023-10-17 ENCOUNTER — Ambulatory Visit
Admission: RE | Admit: 2023-10-17 | Discharge: 2023-10-17 | Disposition: A | Discharge status: Home / Self Care | Source: Ambulatory Visit

## 2023-10-17 DIAGNOSIS — Z1231 Encounter for screening mammogram for malignant neoplasm of breast: Secondary | ICD-10-CM

## 2024-05-31 ENCOUNTER — Other Ambulatory Visit: Payer: Self-pay | Admitting: Oral Surgery

## 2024-06-14 LAB — SURGICAL PATHOLOGY
